# Patient Record
Sex: Male | Born: 1970 | Race: White | Hispanic: No | Marital: Married | Smoking: Never smoker
Health system: Southern US, Community
[De-identification: ages and names within clinical notes are randomized; demographics above are authoritative.]

## PROBLEM LIST (undated history)

## (undated) DIAGNOSIS — K219 Gastro-esophageal reflux disease without esophagitis: Secondary | ICD-10-CM

---

## 2003-08-20 ENCOUNTER — Ambulatory Visit (HOSPITAL_COMMUNITY): Admission: RE | Admit: 2003-08-20 | Discharge: 2003-08-20 | Payer: Self-pay | Admitting: Family Medicine

## 2005-06-17 ENCOUNTER — Emergency Department (HOSPITAL_COMMUNITY): Admission: EM | Admit: 2005-06-17 | Discharge: 2005-06-17 | Payer: Self-pay | Admitting: Emergency Medicine

## 2010-07-26 ENCOUNTER — Ambulatory Visit (HOSPITAL_COMMUNITY)
Admission: RE | Admit: 2010-07-26 | Discharge: 2010-07-26 | Payer: Self-pay | Source: Home / Self Care | Admitting: Internal Medicine

## 2010-07-26 ENCOUNTER — Ambulatory Visit: Payer: Self-pay | Admitting: Internal Medicine

## 2010-07-28 ENCOUNTER — Ambulatory Visit (HOSPITAL_COMMUNITY): Admission: RE | Admit: 2010-07-28 | Discharge: 2010-07-28 | Payer: Self-pay | Admitting: Family Medicine

## 2010-09-07 ENCOUNTER — Ambulatory Visit: Admit: 2010-09-07 | Payer: Self-pay | Admitting: Internal Medicine

## 2010-10-14 ENCOUNTER — Ambulatory Visit (HOSPITAL_COMMUNITY)
Admission: RE | Admit: 2010-10-14 | Discharge: 2010-10-14 | Disposition: A | Discharge status: Home / Self Care | Payer: BC Managed Care – PPO | Source: Ambulatory Visit | Attending: Family Medicine | Admitting: Family Medicine

## 2010-10-14 DIAGNOSIS — IMO0001 Reserved for inherently not codable concepts without codable children: Secondary | ICD-10-CM | POA: Insufficient documentation

## 2010-10-14 DIAGNOSIS — R42 Dizziness and giddiness: Secondary | ICD-10-CM | POA: Insufficient documentation

## 2010-10-14 DIAGNOSIS — R269 Unspecified abnormalities of gait and mobility: Secondary | ICD-10-CM | POA: Insufficient documentation

## 2010-10-18 ENCOUNTER — Ambulatory Visit (HOSPITAL_COMMUNITY)
Admission: RE | Admit: 2010-10-18 | Discharge: 2010-10-18 | Disposition: A | Discharge status: Home / Self Care | Payer: BC Managed Care – PPO | Source: Ambulatory Visit | Attending: *Deleted | Admitting: *Deleted

## 2010-10-21 ENCOUNTER — Ambulatory Visit (HOSPITAL_COMMUNITY)
Admission: RE | Admit: 2010-10-21 | Discharge: 2010-10-21 | Disposition: A | Discharge status: Home / Self Care | Payer: BC Managed Care – PPO | Source: Ambulatory Visit | Attending: *Deleted | Admitting: *Deleted

## 2010-10-26 ENCOUNTER — Ambulatory Visit (HOSPITAL_COMMUNITY)
Admission: RE | Admit: 2010-10-26 | Discharge: 2010-10-26 | Disposition: A | Discharge status: Home / Self Care | Payer: BC Managed Care – PPO | Source: Ambulatory Visit | Attending: *Deleted | Admitting: *Deleted

## 2010-10-28 ENCOUNTER — Ambulatory Visit (HOSPITAL_COMMUNITY)
Admission: RE | Admit: 2010-10-28 | Discharge: 2010-10-28 | Disposition: A | Discharge status: Home / Self Care | Payer: BC Managed Care – PPO | Source: Ambulatory Visit | Attending: Family Medicine | Admitting: Family Medicine

## 2010-10-28 DIAGNOSIS — R42 Dizziness and giddiness: Secondary | ICD-10-CM | POA: Insufficient documentation

## 2010-10-28 DIAGNOSIS — R269 Unspecified abnormalities of gait and mobility: Secondary | ICD-10-CM | POA: Insufficient documentation

## 2010-10-28 DIAGNOSIS — IMO0001 Reserved for inherently not codable concepts without codable children: Secondary | ICD-10-CM | POA: Insufficient documentation

## 2010-11-02 ENCOUNTER — Ambulatory Visit (HOSPITAL_COMMUNITY)
Admission: RE | Admit: 2010-11-02 | Discharge: 2010-11-02 | Disposition: A | Discharge status: Home / Self Care | Payer: BC Managed Care – PPO | Source: Ambulatory Visit | Attending: *Deleted | Admitting: *Deleted

## 2010-11-04 ENCOUNTER — Ambulatory Visit (HOSPITAL_COMMUNITY)
Admission: RE | Admit: 2010-11-04 | Discharge: 2010-11-04 | Disposition: A | Discharge status: Home / Self Care | Payer: BC Managed Care – PPO | Source: Ambulatory Visit | Attending: *Deleted | Admitting: *Deleted

## 2010-11-09 ENCOUNTER — Ambulatory Visit (HOSPITAL_COMMUNITY)
Admission: RE | Admit: 2010-11-09 | Discharge: 2010-11-09 | Disposition: A | Discharge status: Home / Self Care | Payer: BC Managed Care – PPO | Source: Ambulatory Visit | Attending: *Deleted | Admitting: *Deleted

## 2010-11-11 ENCOUNTER — Ambulatory Visit (HOSPITAL_COMMUNITY)
Admission: RE | Admit: 2010-11-11 | Discharge: 2010-11-11 | Disposition: A | Discharge status: Home / Self Care | Payer: BC Managed Care – PPO | Source: Ambulatory Visit | Attending: *Deleted | Admitting: *Deleted

## 2015-05-11 ENCOUNTER — Other Ambulatory Visit (HOSPITAL_COMMUNITY): Payer: Self-pay | Admitting: Family Medicine

## 2015-05-11 ENCOUNTER — Ambulatory Visit (HOSPITAL_COMMUNITY)
Admission: RE | Admit: 2015-05-11 | Discharge: 2015-05-11 | Disposition: A | Discharge status: Home / Self Care | Payer: BC Managed Care – PPO | Source: Ambulatory Visit | Attending: Family Medicine | Admitting: Family Medicine

## 2015-05-11 DIAGNOSIS — R0602 Shortness of breath: Secondary | ICD-10-CM | POA: Diagnosis present

## 2015-05-11 DIAGNOSIS — R0982 Postnasal drip: Secondary | ICD-10-CM

## 2015-07-03 ENCOUNTER — Encounter (INDEPENDENT_AMBULATORY_CARE_PROVIDER_SITE_OTHER): Payer: Self-pay | Admitting: *Deleted

## 2016-07-02 IMAGING — DX DG SINUSES COMPLETE 3+V
3 series · 3 of 3 positions shown · non-contrast
Comparison: None.

CLINICAL DATA: Postnasal drainage.

EXAM:
PARANASAL SINUSES - COMPLETE 3 + VIEW

[pns waters]
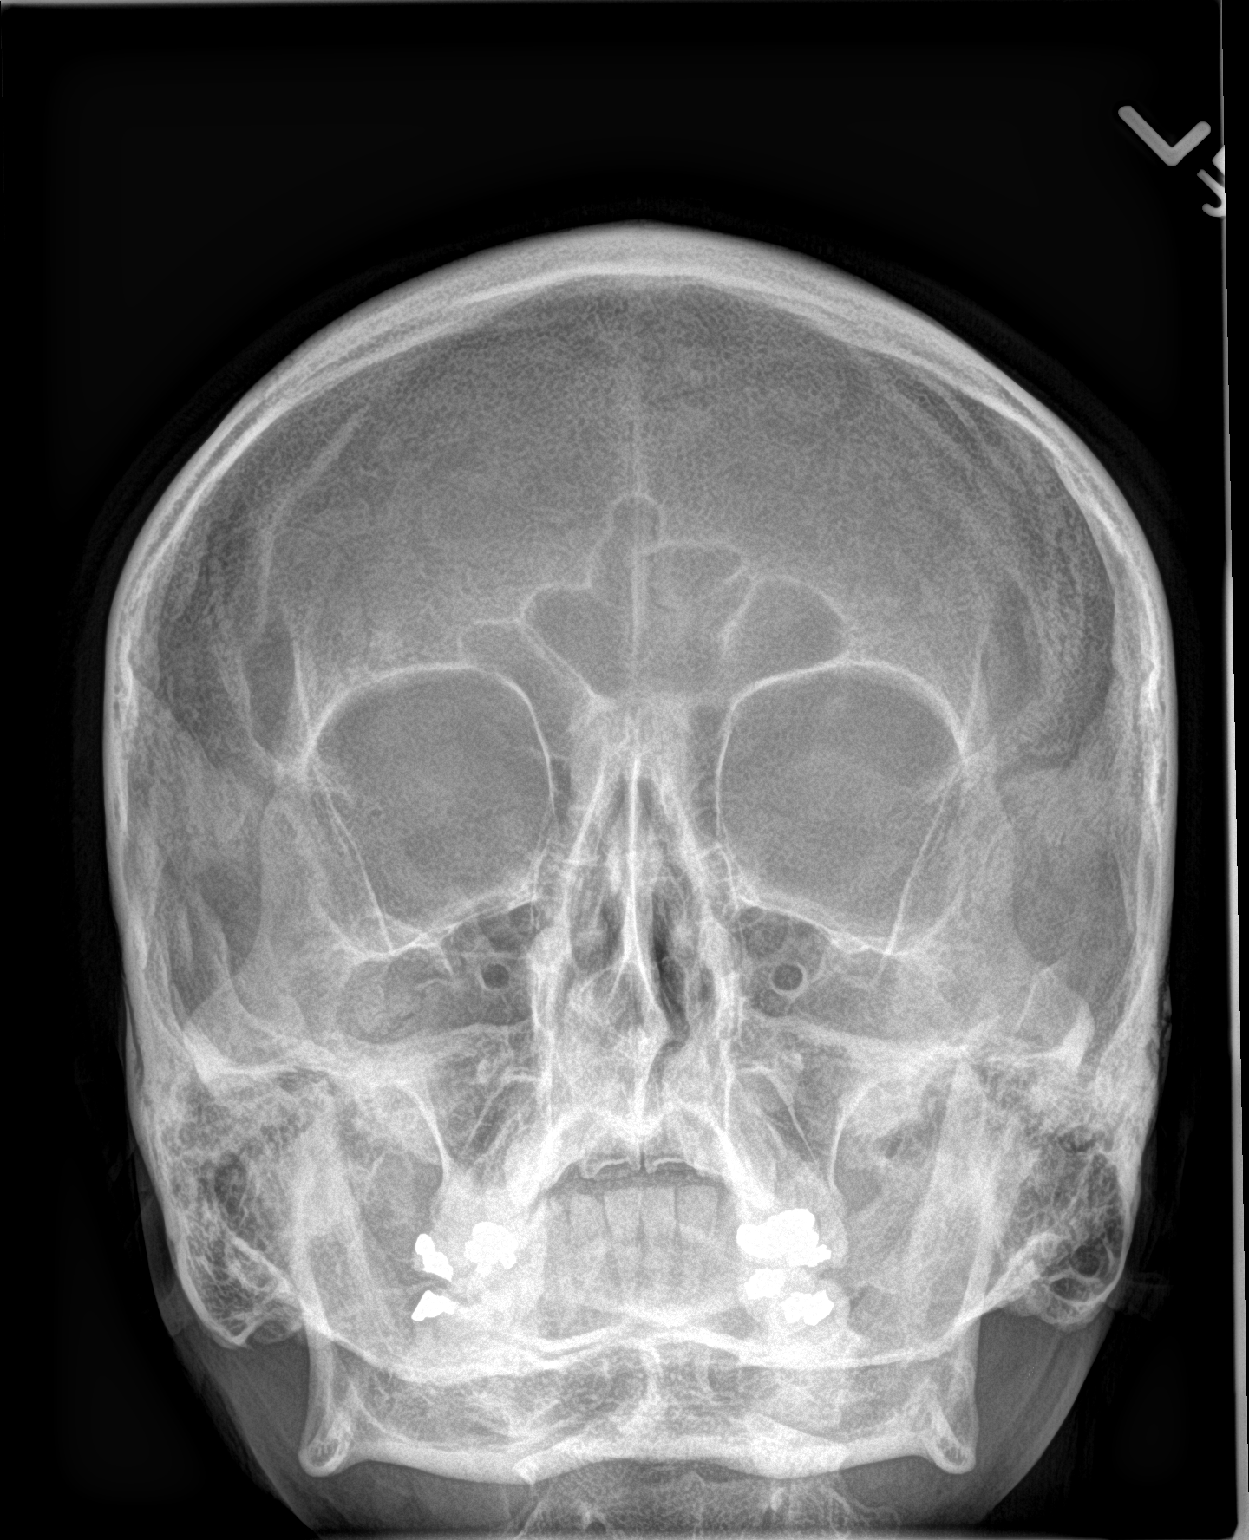

[[person_name]]
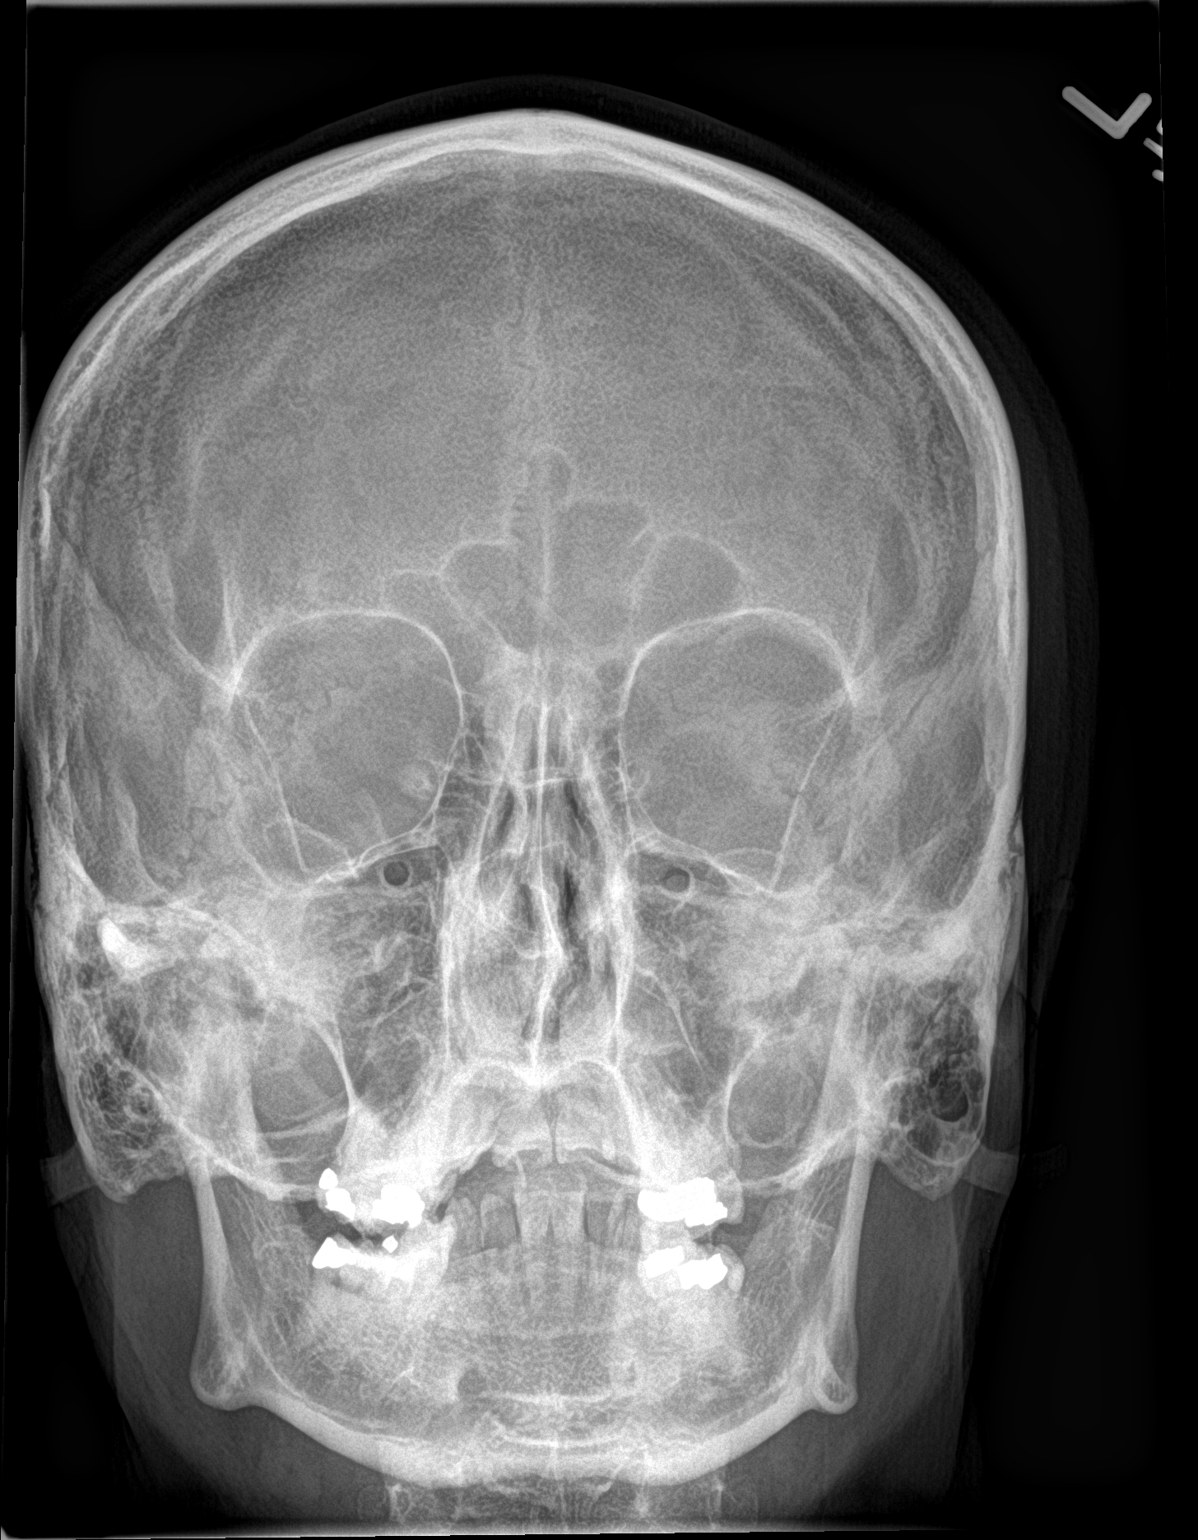

[pns lat]
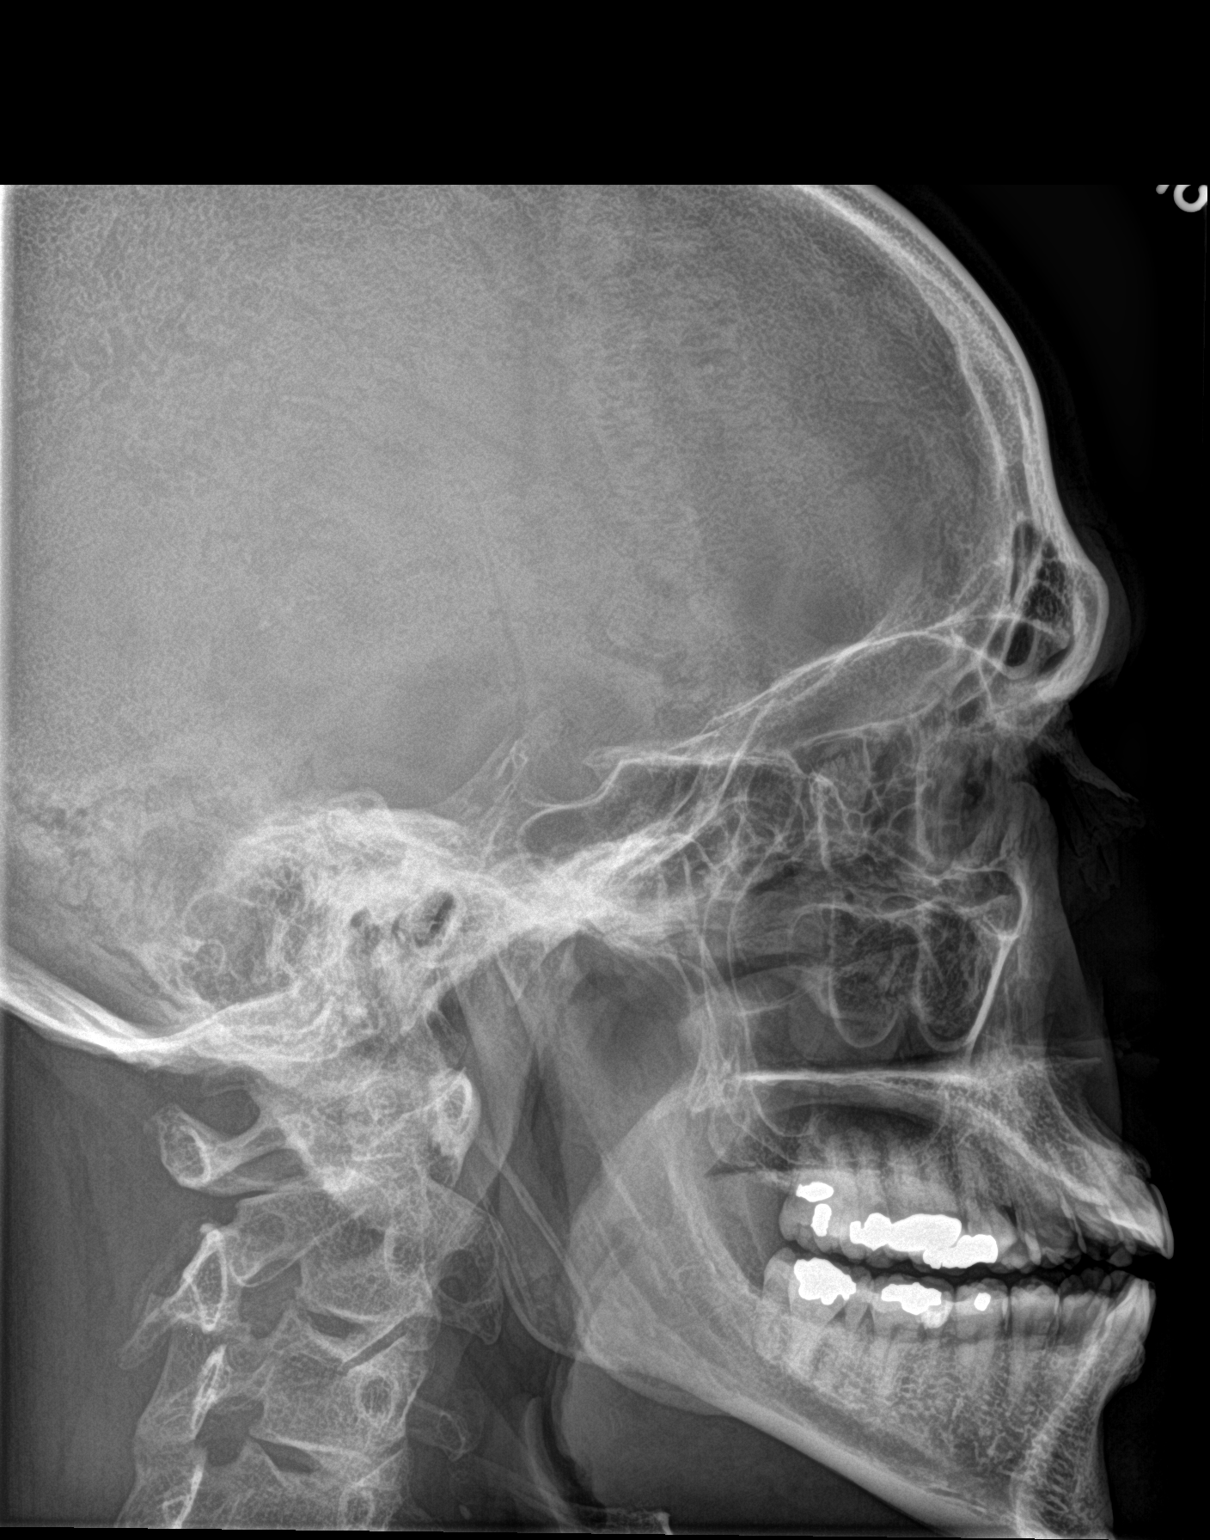

[3 of 3 positions shown; findings below may reference images not displayed]

FINDINGS: The paranasal sinus are aerated. There is no evidence of sinus
opacification air-fluid levels or mucosal thickening. No significant
bone abnormalities are seen.
IMPRESSION: Negative.

## 2016-07-02 IMAGING — DX DG CHEST 2V
2 series · 2 of 2 positions shown · non-contrast
Comparison: Chest radiograph and chest CT June 17, 2005

CLINICAL DATA: Shortness of breath with nasal drainage and sinus
pressure

EXAM:
CHEST  2 VIEW

[chest pa]
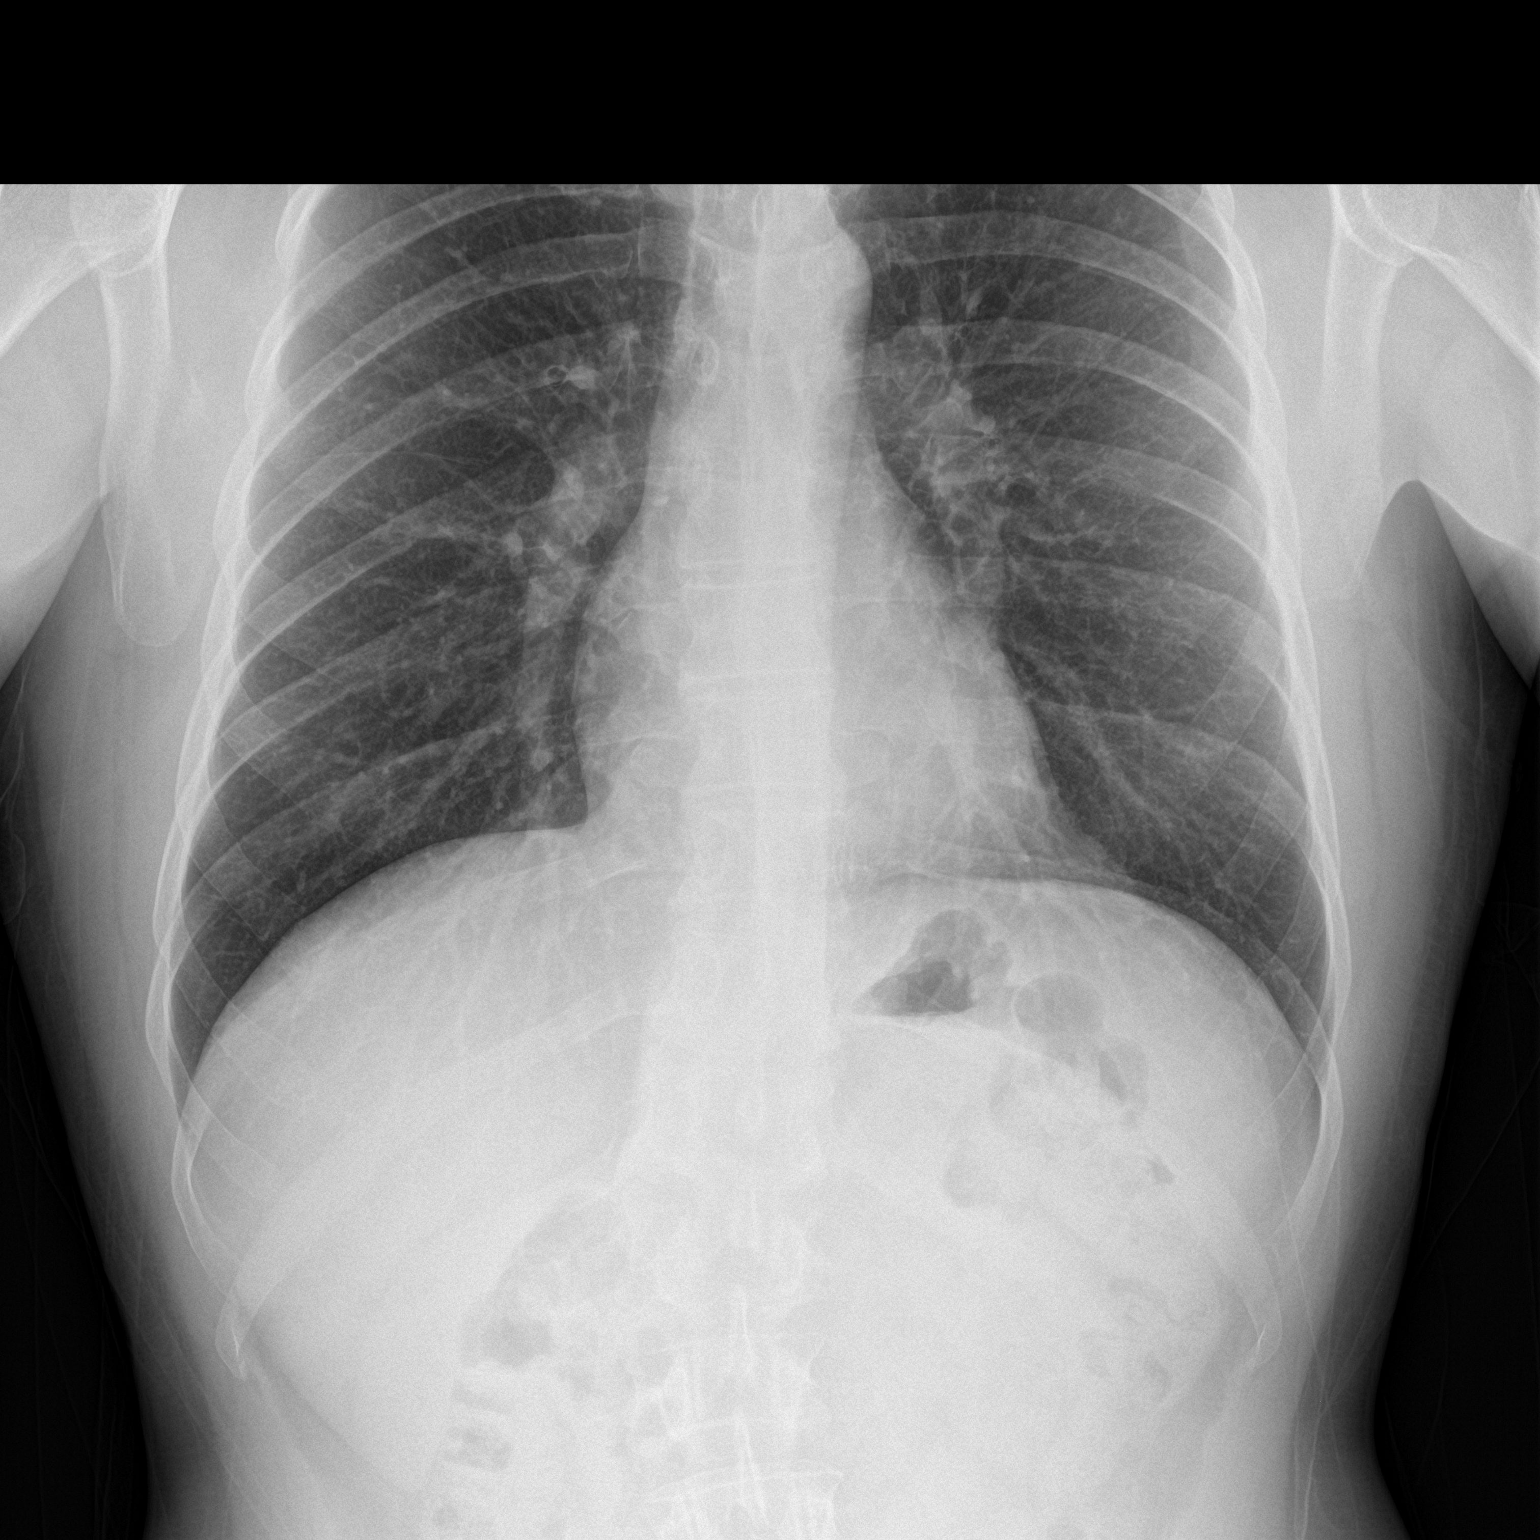

[chest lat]
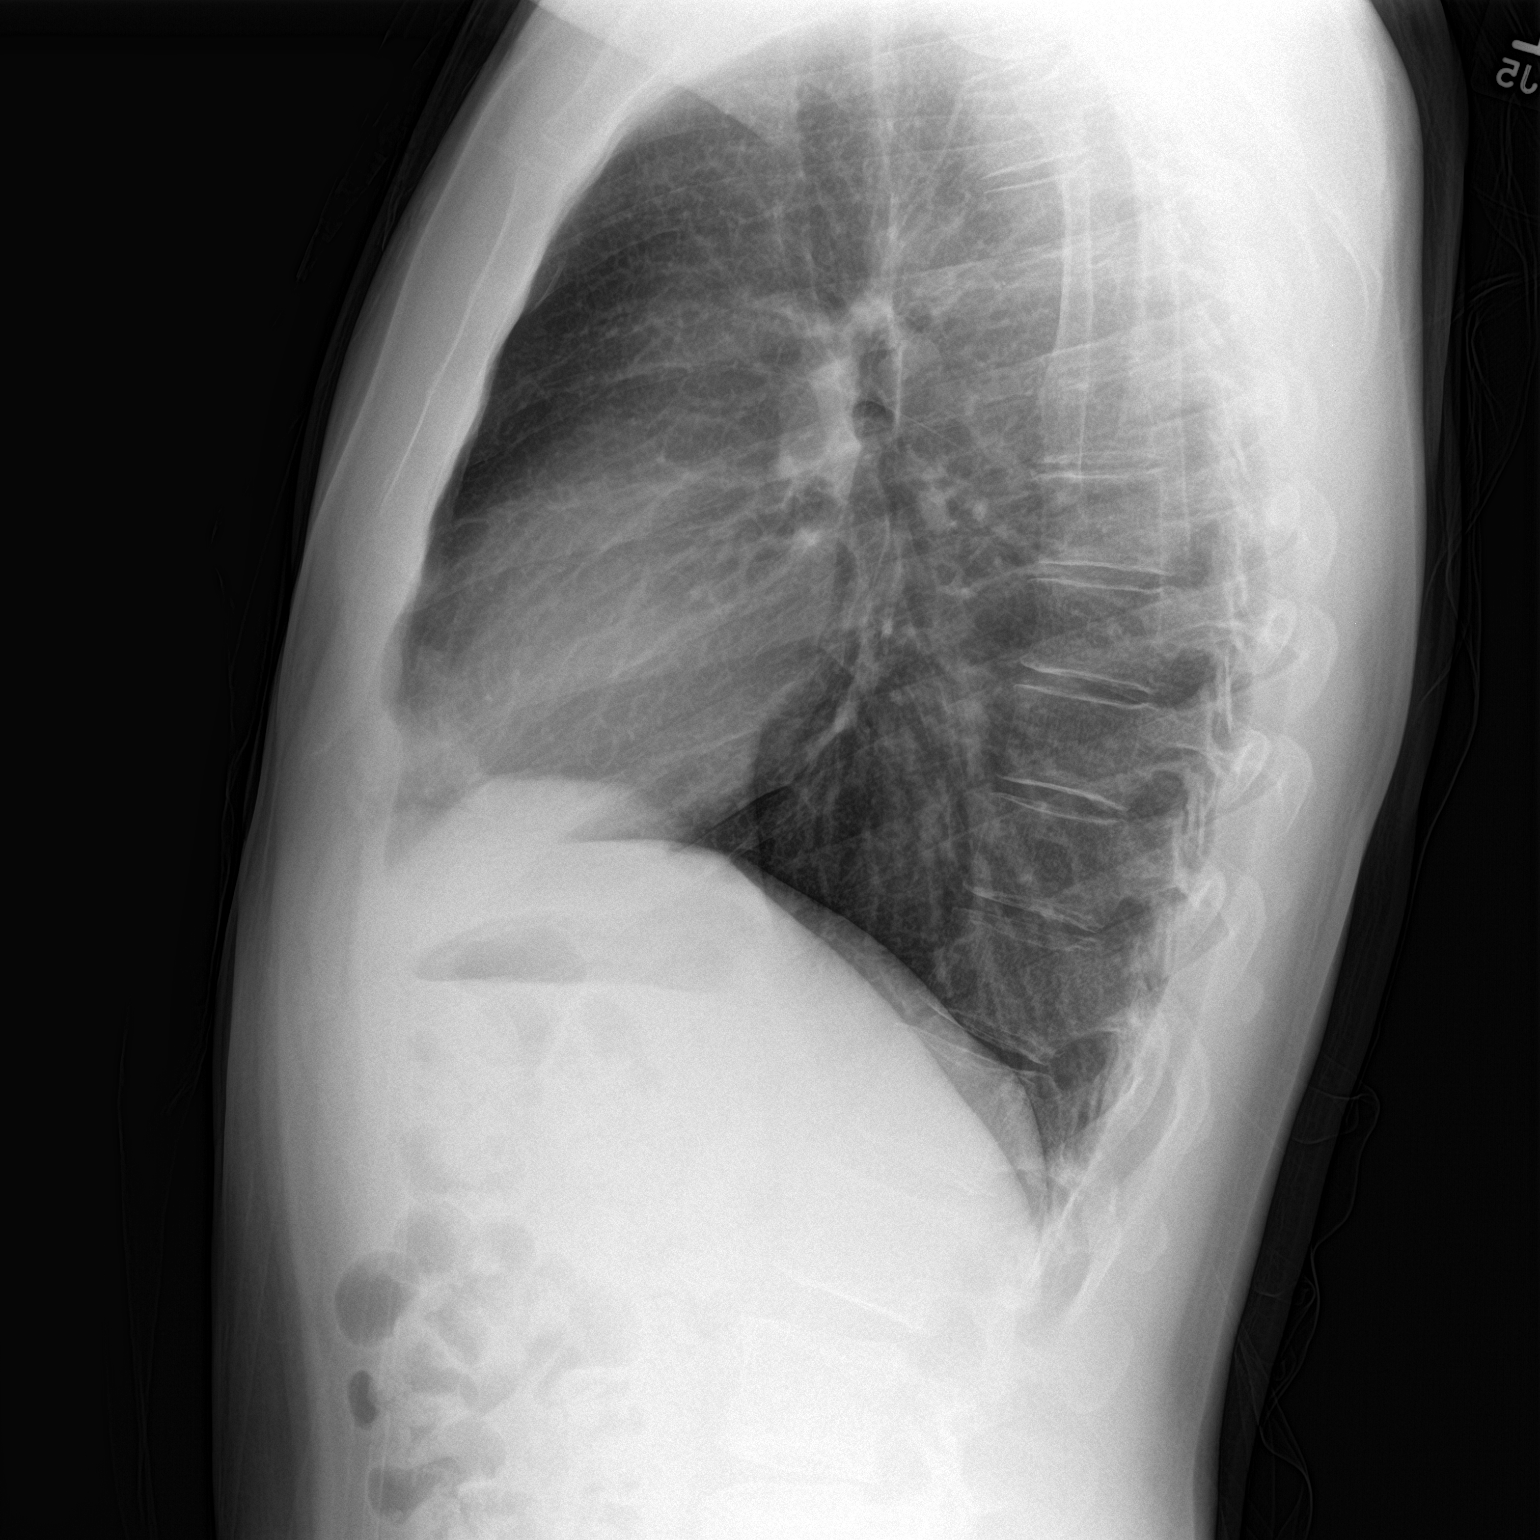

[2 of 2 positions shown; findings below may reference images not displayed]

FINDINGS: Lungs are clear. Heart size and pulmonary vascularity are normal. No
adenopathy. No bone lesions.
IMPRESSION: No abnormality noted.

## 2016-09-30 ENCOUNTER — Encounter: Payer: Self-pay | Admitting: Internal Medicine

## 2017-03-28 ENCOUNTER — Emergency Department (HOSPITAL_COMMUNITY)
Admission: EM | Admit: 2017-03-28 | Discharge: 2017-03-28 | Disposition: A | Discharge status: Home / Self Care | Payer: BC Managed Care – PPO | Attending: Emergency Medicine | Admitting: Emergency Medicine

## 2017-03-28 ENCOUNTER — Encounter (HOSPITAL_COMMUNITY): Payer: Self-pay

## 2017-03-28 ENCOUNTER — Emergency Department (HOSPITAL_COMMUNITY): Payer: BC Managed Care – PPO

## 2017-03-28 DIAGNOSIS — R0789 Other chest pain: Secondary | ICD-10-CM | POA: Insufficient documentation

## 2017-03-28 DIAGNOSIS — K210 Gastro-esophageal reflux disease with esophagitis: Secondary | ICD-10-CM | POA: Insufficient documentation

## 2017-03-28 DIAGNOSIS — R079 Chest pain, unspecified: Secondary | ICD-10-CM | POA: Diagnosis present

## 2017-03-28 DIAGNOSIS — K219 Gastro-esophageal reflux disease without esophagitis: Secondary | ICD-10-CM

## 2017-03-28 DIAGNOSIS — Z79899 Other long term (current) drug therapy: Secondary | ICD-10-CM | POA: Diagnosis not present

## 2017-03-28 HISTORY — DX: Gastro-esophageal reflux disease without esophagitis: K21.9

## 2017-03-28 LAB — COMPREHENSIVE METABOLIC PANEL
ALT: 22 U/L (ref 17–63)
AST: 20 U/L (ref 15–41)
Albumin: 3.8 g/dL (ref 3.5–5.0)
Alkaline Phosphatase: 63 U/L (ref 38–126)
Anion gap: 6 (ref 5–15)
BUN: 15 mg/dL (ref 6–20)
CO2: 27 mmol/L (ref 22–32)
Calcium: 8.7 mg/dL — ABNORMAL LOW (ref 8.9–10.3)
Chloride: 104 mmol/L (ref 101–111)
Creatinine, Ser: 1.09 mg/dL (ref 0.61–1.24)
GFR calc Af Amer: >60 mL/min (ref >60)
GFR calc non Af Amer: >60 mL/min (ref >60)
Glucose, Bld: 101 mg/dL — ABNORMAL HIGH (ref 65–99)
Potassium: 3.2 mmol/L — ABNORMAL LOW (ref 3.5–5.1)
Sodium: 137 mmol/L (ref 135–145)
Total Bilirubin: 0.6 mg/dL (ref 0.3–1.2)
Total Protein: 6.5 g/dL (ref 6.5–8.1)

## 2017-03-28 LAB — CBC
HCT: 39.9 % (ref 39.0–52.0)
Hemoglobin: 14.1 g/dL (ref 13.0–17.0)
MCH: 31.2 pg (ref 26.0–34.0)
MCHC: 35.3 g/dL (ref 30.0–36.0)
MCV: 88.3 fL (ref 78.0–100.0)
Platelets: 129 10*3/uL — ABNORMAL LOW (ref 150–400)
RBC: 4.52 MIL/uL (ref 4.22–5.81)
RDW: 12.2 % (ref 11.5–15.5)
WBC: 5.7 10*3/uL (ref 4.0–10.5)

## 2017-03-28 LAB — LIPASE, BLOOD: Lipase: 33 U/L (ref 11–51)

## 2017-03-28 LAB — TROPONIN I: Troponin I: <0.03 ng/mL (ref <0.03)

## 2017-03-28 MED ORDER — GI COCKTAIL ~~LOC~~
30.0000 mL | Freq: Once | ORAL | Status: AC
  Administered 2017-03-28: 30 mL via ORAL
  Filled 2017-03-28: qty 30

## 2017-03-28 NOTE — ED Triage Notes (Signed)
Pt reports chest pain that started after going out to eat tonight around 8 pm. Pt reports at first it was heartburn as he had a lot of belching. Pt took TUMS and an extra Protonix with no relief. Pt reports intermittent pain to central chest through to back with some sharp pains to left chest. Pain described as squeezing, tightness in chest. Pt reports having to take a "deeper breath to feel at ease".

## 2017-03-28 NOTE — ED Notes (Signed)
ED Provider at bedside. 

## 2017-03-28 NOTE — ED Notes (Signed)
Patient transported to X-ray 

## 2017-03-28 NOTE — ED Provider Notes (Signed)
[facility]PT Provider Note   CSN: [id number] Arrival date & time: 03/28/17  0044     History   Chief Complaint Chief Complaint  Patient presents with  . Chest Pain    HPI Christian Osborne is a 46 y.o. male.  HPI  This is a 46 year old male with a history of GERD who presents for chest pain. Onset of pain was after eating. He has had pain since 7 or 8 pm. He reports anterior pressure-like pain. It also radiated to his back. Current pain is 5 out of 10; however, patient states that it was much worse earlier. He reports initially he thought it was his reflux. He had belching. He took an additional Protonix and a Tums with minimal relief. Denies any exertional symptoms or shortness of breath. No history of high blood pressure, high cholesterol, diabetes, smoking, early family history of heart disease.  Past Medical History:  Diagnosis Date  . GERD (gastroesophageal reflux disease)     There are no active problems to display for this patient.   History reviewed. No pertinent surgical history.     Home Medications    Prior to Admission medications   Medication Sig Start Date End Date Taking? Authorizing Provider  pantoprazole (PROTONIX) 40 MG tablet Take 40 mg by mouth daily.   Yes [provider]    Family History No family history on file.  Social History Social History  Substance Use Topics  . Smoking status: Never Smoker  . Smokeless tobacco: Never Used  . Alcohol use No     Allergies   Patient has no known allergies.   Review of Systems Review of Systems  Constitutional: Negative for fever.  Respiratory: Positive for shortness of breath.   Cardiovascular: Positive for chest pain. Negative for leg swelling.  Gastrointestinal: Negative for abdominal pain, nausea and vomiting.       Belching  All other systems reviewed and are negative.    Physical Exam Updated Vital Signs BP 111/78   Pulse (!) 47   Resp 15   Ht 5\' 11"  (1.803 m)    Wt 79.4 kg (175 lb)   SpO2 100%   BMI 24.41 kg/m   Physical Exam  Constitutional: He is oriented to person, place, and time. He appears well-developed and well-nourished. No distress.  HENT:  Head: Normocephalic and atraumatic.  Cardiovascular: Normal rate, regular rhythm and normal heart sounds.   No murmur heard. Pulmonary/Chest: Effort normal and breath sounds normal. No respiratory distress. He has no wheezes.  Abdominal: Soft. Bowel sounds are normal. There is no tenderness. There is no rebound.  Musculoskeletal: He exhibits no edema.  Neurological: He is alert and oriented to person, place, and time.  Skin: Skin is warm and dry.  Psychiatric: He has a normal mood and affect.  Nursing note and vitals reviewed.    ED Treatments / Results  Labs (all labs ordered are listed, but only abnormal results are displayed) Labs Reviewed  CBC - Abnormal; Notable for the following:       Result Value   Platelets 129 (*)    All other components within normal limits  COMPREHENSIVE METABOLIC PANEL - Abnormal; Notable for the following:    Potassium 3.2 (*)    Glucose, Bld 101 (*)    Calcium 8.7 (*)    All other components within normal limits  TROPONIN I  LIPASE, BLOOD    EKG  EKG Interpretation  Date/Time:  Tuesday March 28 2017 00:54:56 EDT  Ventricular Rate:  60 PR Interval:    QRS Duration: 92 QT Interval:  412 QTC Calculation: 412 R Axis:   67 Text Interpretation:  Sinus rhythm No prior for comparison Confirmed by Ross MarcusHorton, Courtney ([id number]) on 03/28/2017 12:59:27 AM       Radiology Dg Chest 2 View  Result Date: 03/28/2017 CLINICAL DATA:  Left upper chest pain to the mid chest and back since 03/27/2017. Shortness of breath. EXAM: CHEST  2 VIEW COMPARISON:  05/11/2015 FINDINGS: The heart size and mediastinal contours are within normal limits. Both lungs are clear. The visualized skeletal structures are unremarkable. IMPRESSION: No active cardiopulmonary disease.  Electronically Signed   By: Burman NievesWilliam  Stevens M.D.   On: 03/28/2017 02:10    Procedures Procedures (including critical care time)  Medications Ordered in ED Medications  gi cocktail (Maalox,Lidocaine,Donnatal) (30 mLs Oral Given 03/28/17 0123)     Initial Impression / Assessment and Plan / ED Course  I have reviewed the triage vital signs and the nursing notes.  Pertinent labs & imaging results that were available during my care of the patient were reviewed by me and considered in my medical decision making (see chart for details).    Patient presents for chest pain. Ongoing for several hours. Nontoxic on exam. EKG is nonischemic. Chest x-ray is reassuring. Troponin is negative. Low suspicion at this time for ACS. Seems much more related to GI. Could be reflux versus peptic ulcer. Patient was given a GI cocktail. Pain reduced to 1 following GI cocktail. On recheck, he is nontoxic. Discussed workup with the patient. Recommend GI evaluation for endoscopy. Patient states over last 2 weeks he has had worsening reflux symptoms in general. He is already taking Protonix 40 mg twice a day. Continue and follow-up with GI.  After history, exam, and medical workup I feel the patient has been appropriately medically screened and is safe for discharge home. Pertinent diagnoses were discussed with the patient. Patient was given return precautions.   Final Clinical Impressions(s) / ED Diagnoses   Final diagnoses:  Atypical chest pain  Gastroesophageal reflux disease, esophagitis presence not specified    New Prescriptions New Prescriptions   No medications on file     Shon BatonHorton, Courtney F, MD 03/28/17 [phone]

## 2017-03-28 NOTE — Discharge Instructions (Signed)
You were seen today for chest pain. Her workup is reassuring. This is likely related to gastroesophageal reflux disease, gastritis, peptic ulcers. Your heart workup was reassuring. Follow closely with your primary physician and gastroenterology to schedule an endoscopy if symptoms persist.

## 2017-03-29 ENCOUNTER — Telehealth: Payer: Self-pay | Admitting: Gastroenterology

## 2017-03-29 NOTE — Telephone Encounter (Signed)
I tried calling Dr Michelle NasutiKnowlton's office and they closed today at 2pm and will reopen on Monday. The girl I spoke with earlier said that she would follow up with me on Monday. I will let her know then.

## 2017-03-29 NOTE — Telephone Encounter (Signed)
Dr Michelle NasutiKnowlton's office called to say that patient was seen in the ER last night and he hasn't seen NUR in over 3 years and that would make him a new patient. NUR can't see him until the end of the month. I told her since he has been established with NUR that I would have to get permission from our provider to accept him as a new patient and our schedule is farther out than NUR. It would be the end of September or early Oct before we had any openings. Please advise if we are accepting new patients that have not seen NUR in the past 3 years.

## 2017-03-29 NOTE — Telephone Encounter (Signed)
Darl PikesSusan, please let Dr. Sudie BaileyKnowlton office know that if a patient is established with Dr. Karilyn Cotaehman they should continue care with his practice.   Copying Reba on this as a Financial plannerYI

## 2021-08-09 ENCOUNTER — Encounter: Payer: Self-pay | Admitting: Gastroenterology

## 2021-09-21 ENCOUNTER — Encounter: Payer: Self-pay | Admitting: Gastroenterology

## 2021-09-21 ENCOUNTER — Other Ambulatory Visit: Payer: Self-pay

## 2021-09-21 ENCOUNTER — Ambulatory Visit (INDEPENDENT_AMBULATORY_CARE_PROVIDER_SITE_OTHER): Payer: BC Managed Care – PPO | Admitting: Gastroenterology

## 2021-09-21 VITALS — BP 110/82 | HR 75 | Ht 71.0 in | Wt 189.0 lb

## 2021-09-21 DIAGNOSIS — Z9189 Other specified personal risk factors, not elsewhere classified: Secondary | ICD-10-CM | POA: Diagnosis not present

## 2021-09-21 DIAGNOSIS — Z1211 Encounter for screening for malignant neoplasm of colon: Secondary | ICD-10-CM | POA: Diagnosis not present

## 2021-09-21 DIAGNOSIS — Z860100 Personal history of colon polyps, unspecified: Secondary | ICD-10-CM

## 2021-09-21 DIAGNOSIS — K219 Gastro-esophageal reflux disease without esophagitis: Secondary | ICD-10-CM | POA: Diagnosis not present

## 2021-09-21 DIAGNOSIS — Z1212 Encounter for screening for malignant neoplasm of rectum: Secondary | ICD-10-CM

## 2021-09-21 MED ORDER — PANTOPRAZOLE SODIUM 40 MG PO TBEC: 40.0000 mg | DELAYED_RELEASE_TABLET | Freq: Every day | ORAL | 4 refills | Status: AC

## 2021-09-21 NOTE — Progress Notes (Signed)
Chief Complaint: For GI eval  Referring Provider:  Gareth Morgan, MD      ASSESSMENT AND PLAN;   #1.  Longstanding GERD despite Protonix  #2. H/O polyps 2011. Colorectal cancer screening/surveillance.  Plan: -EGD/colon -Continue protonix 40mg  po QD #90, 4 RF   I discussed EGD/Colonoscopy- the indications, risks, alternatives and potential complications including, but not limited to, bleeding, infection, reaction to medication, damage to internal organs, cardiac and/or pulmonary problems, and perforation requiring surgery. The possibility that significant findings could be missed was explained. All ? were answered. The patient gives consent to proceed.  HPI:    Christian Osborne is a 51 y.o. male  Color blind Long standing GERD without dysphagia.  Has been on Protonix over the last 5 to 6 years.  He does have increasing symptoms if he misses a dose.  Very much concerned regarding Barrett's esophagus since he is at higher risk.  Never had EGD before.  Constipation occ bloating Bms 1/2-4 days, pellet like stools, at times hard to pass with associated abdominal bloating and lower abdominal discomfort which gets better with defecation.  No melena or hematochezia.  No fever chills or night sweats.  No family history of colon cancer.   Had colon over 10 yrs ago- Nov 2011- polyp (Dr Karilyn Cota). Bx- TA.  Letter was sent to be repeated in 3 years.   SH- Married to [place]. Works in [employer]  Past Medical History:  Diagnosis Date   Allergies    GERD (gastroesophageal reflux disease)     History reviewed. No pertinent surgical history.  Family History  Problem Relation Age of Onset   Liver disease Mother    Cirrhosis Mother    Colon cancer Neg Hx    Rectal cancer Neg Hx    Stomach cancer Neg Hx    Esophageal cancer Neg Hx    Pancreatic cancer Neg Hx     Social History   Tobacco Use   Smoking status: Never   Smokeless tobacco: Never  Vaping Use   Vaping  Use: Never used  Substance Use Topics   Alcohol use: Never   Drug use: No    Current Outpatient Medications  Medication Sig Dispense Refill   ibuprofen (ADVIL) 200 MG tablet Take 200 mg by mouth every 6 (six) hours as needed.     pantoprazole (PROTONIX) 40 MG tablet Take 40 mg by mouth daily.     No current facility-administered medications for this visit.    No Known Allergies  Review of Systems:  Constitutional: Denies fever, chills, diaphoresis, appetite change and fatigue.  HEENT: Denies photophobia, eye pain, redness, hearing loss, ear pain, congestion, sore throat, rhinorrhea, sneezing, mouth sores, neck pain, neck stiffness and tinnitus.   Respiratory: Denies SOB, DOE, cough, chest tightness,  and wheezing.   Cardiovascular: Denies chest pain, palpitations and leg swelling.  Genitourinary: Denies dysuria, urgency, frequency, hematuria, flank pain and difficulty urinating.  Musculoskeletal: Denies myalgias, back pain, joint swelling, arthralgias and gait problem.  Skin: No rash.  Neurological: Denies dizziness, seizures, syncope, weakness, light-headedness, numbness and headaches.  Hematological: Denies adenopathy. Easy bruising, personal or family bleeding history  Psychiatric/Behavioral: No anxiety or depression     Physical Exam:    BP 110/82    Pulse 75    Ht 5\' 11"  (1.803 m)    Wt 189 lb (85.7 kg)    SpO2 98%    BMI 26.36 kg/m  Wt Readings from Last 3 Encounters:  09/21/21 189 lb (85.7 kg)  03/28/17 175 lb (79.4 kg)   Constitutional:  Well-developed, in no acute distress. Psychiatric: Normal mood and affect. Behavior is normal. HEENT: Pupils normal.  Conjunctivae are normal. No scleral icterus. Neck supple.  Cardiovascular: Normal rate, regular rhythm. No edema Pulmonary/chest: Effort normal and breath sounds normal. No wheezing, rales or rhonchi. Abdominal: Soft, nondistended. Nontender. Bowel sounds active throughout. There are no masses palpable. No  hepatomegaly. Rectal: Deferred Neurological: Alert and oriented to person place and time. Skin: Skin is warm and dry. No rashes noted.  Data Reviewed: I have personally reviewed following labs and imaging studies  CBC: CBC Latest Ref Rng & Units 03/28/2017  WBC 4.0 - 10.5 K/uL 5.7  Hemoglobin 13.0 - 17.0 g/dL 14.1  Hematocrit 39.0 - 52.0 % 39.9  Platelets 150 - 400 K/uL 129(L)    CMP: CMP Latest Ref Rng & Units 03/28/2017  Glucose 65 - 99 mg/dL 101(H)  BUN 6 - 20 mg/dL 15  Creatinine 0.61 - 1.24 mg/dL 1.09  Sodium 135 - 145 mmol/L 137  Potassium 3.5 - 5.1 mmol/L 3.2(L)  Chloride 101 - 111 mmol/L 104  CO2 22 - 32 mmol/L 27  Calcium 8.9 - 10.3 mg/dL 8.7(L)  Total Protein 6.5 - 8.1 g/dL 6.5  Total Bilirubin 0.3 - 1.2 mg/dL 0.6  Alkaline Phos 38 - 126 U/L 63  AST 15 - 41 U/L 20  ALT 17 - 63 U/L 22       Carmell Austria, MD 09/21/2021, 2:08 PM  Cc: Lemmie Evens, MD

## 2021-09-21 NOTE — Patient Instructions (Addendum)
If you are age 52 or older, your body mass index should be between 23-30. Your Body mass index is 26.36 kg/m. If this is out of the aforementioned range listed, please consider follow up with your Primary Care Provider.  If you are age 3 or younger, your body mass index should be between 19-25. Your Body mass index is 26.36 kg/m. If this is out of the aformentioned range listed, please consider follow up with your Primary Care Provider.   ________________________________________________________  The [facility] providers would like to encourage you to use [facility] to communicate with providers for non-urgent requests or questions.  Due to long hold times on the telephone, sending your provider a message by [facility] may be a faster and more efficient way to get a response.  Please allow 48 business hours for a response.  Please remember that this is for non-urgent requests.  _______________________________________________________  Continue Protonix 40mg  daily  We have given you samples of the following medication to take: Clenpiq  You have been scheduled for an endoscopy and colonoscopy. Please follow the written instructions given to you at your visit today. Please pick up your prep supplies at the pharmacy within the next 1-3 days. If you use inhalers (even only as needed), please bring them with you on the day of your procedure.  Please call with any questions or concerns.  Thank you,  Dr. Jackquline Denmark

## 2021-11-19 ENCOUNTER — Encounter: Payer: BC Managed Care – PPO | Admitting: Gastroenterology

## 2022-08-04 ENCOUNTER — Other Ambulatory Visit: Payer: Self-pay | Admitting: *Deleted

## 2022-08-04 ENCOUNTER — Encounter: Payer: Self-pay | Admitting: *Deleted

## 2022-08-04 DIAGNOSIS — R002 Palpitations: Secondary | ICD-10-CM

## 2022-08-31 ENCOUNTER — Other Ambulatory Visit: Payer: Self-pay | Admitting: *Deleted

## 2022-08-31 DIAGNOSIS — R002 Palpitations: Secondary | ICD-10-CM

## 2022-12-30 ENCOUNTER — Ambulatory Visit (HOSPITAL_COMMUNITY)
Admission: RE | Admit: 2022-12-30 | Discharge: 2022-12-30 | Disposition: A | Discharge status: Home / Self Care | Payer: BC Managed Care – PPO | Source: Ambulatory Visit | Attending: Family Medicine | Admitting: Family Medicine

## 2022-12-30 ENCOUNTER — Other Ambulatory Visit (HOSPITAL_COMMUNITY): Payer: Self-pay | Admitting: Family Medicine

## 2022-12-30 DIAGNOSIS — R0609 Other forms of dyspnea: Secondary | ICD-10-CM

## 2023-09-05 ENCOUNTER — Encounter: Payer: Self-pay | Admitting: Gastroenterology

## 2023-11-03 ENCOUNTER — Encounter: Payer: Self-pay | Admitting: Gastroenterology
